# Patient Record
Sex: Male | Born: 1975 | Race: Black or African American | Hispanic: No | Marital: Single | Smoking: Former smoker
Health system: Southern US, Community
[De-identification: ages and names within clinical notes are randomized; demographics above are authoritative.]

## PROBLEM LIST (undated history)

## (undated) DIAGNOSIS — K219 Gastro-esophageal reflux disease without esophagitis: Secondary | ICD-10-CM

## (undated) DIAGNOSIS — J45909 Unspecified asthma, uncomplicated: Secondary | ICD-10-CM

---

## 2019-06-13 ENCOUNTER — Emergency Department (HOSPITAL_COMMUNITY)
Admission: EM | Admit: 2019-06-13 | Discharge: 2019-06-13 | Disposition: A | Discharge status: Home / Self Care | Payer: PRIVATE HEALTH INSURANCE | Attending: Emergency Medicine | Admitting: Emergency Medicine

## 2019-06-13 ENCOUNTER — Emergency Department (HOSPITAL_COMMUNITY): Payer: PRIVATE HEALTH INSURANCE

## 2019-06-13 ENCOUNTER — Encounter (HOSPITAL_COMMUNITY): Payer: Self-pay

## 2019-06-13 DIAGNOSIS — J4521 Mild intermittent asthma with (acute) exacerbation: Secondary | ICD-10-CM | POA: Insufficient documentation

## 2019-06-13 DIAGNOSIS — Z87891 Personal history of nicotine dependence: Secondary | ICD-10-CM | POA: Diagnosis not present

## 2019-06-13 DIAGNOSIS — R0602 Shortness of breath: Secondary | ICD-10-CM | POA: Diagnosis present

## 2019-06-13 HISTORY — DX: Unspecified asthma, uncomplicated: J45.909

## 2019-06-13 HISTORY — DX: Gastro-esophageal reflux disease without esophagitis: K21.9

## 2019-06-13 MED ORDER — FLUTICASONE-SALMETEROL 100-50 MCG/DOSE IN AEPB: 1.0000 | INHALATION_SPRAY | Freq: Two times a day (BID) | RESPIRATORY_TRACT | 0 refills | Status: AC

## 2019-06-13 MED ORDER — PREDNISONE 50 MG PO TABS: 50.0000 mg | ORAL_TABLET | Freq: Every day | ORAL | 0 refills | Status: AC

## 2019-06-13 MED ORDER — ALBUTEROL SULFATE HFA 108 (90 BASE) MCG/ACT IN AERS
2.0000 | INHALATION_SPRAY | Freq: Once | RESPIRATORY_TRACT | Status: AC
  Administered 2019-06-13: 15:00:00 2 via RESPIRATORY_TRACT
  Filled 2019-06-13: qty 6.7

## 2019-06-13 MED ORDER — PREDNISONE 20 MG PO TABS
60.0000 mg | ORAL_TABLET | Freq: Once | ORAL | Status: AC
  Administered 2019-06-13: 15:00:00 60 mg via ORAL
  Filled 2019-06-13 (×2): qty 3

## 2019-06-13 NOTE — Discharge Instructions (Signed)
Take Advair twice daily as prescribed.  Take prednisone as prescribed until completed.  Use albuterol inhaler every 4-6 hours as needed for shortness of breath, wheezing, or chest tightness.  Please follow-up establish care with a primary care provider by calling one of the numbers below.

## 2019-06-13 NOTE — ED Provider Notes (Signed)
Paul Mckinney Provider Note   CSN: [id number] Arrival date & time: 06/13/19  1412    History   Chief Complaint Chief Complaint  Patient presents with  . Asthma    HPI Paul Mckinney is a 43 y.o. male with history of asthma, GERD who presents with a one-week history of intermittent flareup of his asthma.  He is associated productive cough, wheezing, shortness of breath, chest tightness.  He denies any fever, loss of taste or smell.  He reports this feels like his normal asthma flares except for the mucus in his cough.  He has been using his nieces albuterol inhaler with relief, however he does not have any medication of his own right now.  [place]  He used to be on Advair a few months ago.     HPI  Past Medical History:  Diagnosis Date  . Asthma   . GERD (gastroesophageal reflux disease)     There are no active problems to display for this patient.   History reviewed. No pertinent surgical history.      Home Medications    Prior to Admission medications   Medication Sig Start Date End Date Taking? Authorizing Provider  Fluticasone-Salmeterol (ADVAIR DISKUS) 100-50 MCG/DOSE AEPB Inhale 1 puff into the lungs 2 (two) times daily. 06/13/19   Catricia Scheerer, Waylan BogaAlexandra M, PA-C  predniSONE (DELTASONE) 50 MG tablet Take 1 tablet (50 mg total) by mouth daily with breakfast. 06/13/19   Ramona Ruark, Waylan BogaAlexandra M, PA-C    Family History Family History  Problem Relation Age of Onset  . Seizures Mother   . Hypertension Father     Social History Social History   Tobacco Use  . Smoking status: Former Smoker    Types: Cigarettes  . Smokeless tobacco: Never Used  Substance Use Topics  . Alcohol use: Yes  . Drug use: Never     Allergies   Patient has no known allergies.   Review of Systems Review of Systems  Constitutional: Negative for fever.  Respiratory: Positive for cough, chest tightness, shortness of breath and  wheezing.      Physical Exam Updated Vital Signs BP (!) 139/92 (BP Location: Left Arm)   Pulse 62   Temp 98.7 F (37.1 C) (Oral)   Resp 15   Ht 6\' 3"  (1.905 m)   Wt 128.4 kg   SpO2 97%   BMI 35.37 kg/m   Physical Exam Vitals signs and nursing note reviewed.  Constitutional:      General: He is not in acute distress.    Appearance: He is well-developed. He is not diaphoretic.  HENT:     Head: Normocephalic and atraumatic.     Mouth/Throat:     Pharynx: No oropharyngeal exudate.  Eyes:     General: No scleral icterus.       Right eye: No discharge.        Left eye: No discharge.     Conjunctiva/sclera: Conjunctivae normal.     Pupils: Pupils are equal, round, and reactive to light.  Neck:     Musculoskeletal: Normal range of motion and neck supple.     Thyroid: No thyromegaly.  Cardiovascular:     Rate and Rhythm: Normal rate and regular rhythm.     Heart sounds: Normal heart sounds. No murmur. No friction rub. No gallop.   Pulmonary:     Effort: Pulmonary effort is normal. No respiratory distress.     Breath sounds: Normal  breath sounds. No stridor. No wheezing or rales.  Abdominal:     General: Bowel sounds are normal. There is no distension.     Palpations: Abdomen is soft.     Tenderness: There is no abdominal tenderness. There is no guarding or rebound.  Lymphadenopathy:     Cervical: No cervical adenopathy.  Skin:    General: Skin is warm and dry.     Coloration: Skin is not pale.     Findings: No rash.  Neurological:     Mental Status: He is alert.     Coordination: Coordination normal.      ED Treatments / Results  Labs (all labs ordered are listed, but only abnormal results are displayed) Labs Reviewed - No data to display  EKG None  Radiology Dg Chest 2 View  Result Date: 06/13/2019 CLINICAL DATA:  Cough.  Asthma. EXAM: CHEST - 2 VIEW COMPARISON:  None. FINDINGS: Lungs are clear. Heart size and pulmonary vascularity are normal. No  adenopathy. No pneumothorax. No bone lesions. IMPRESSION: No edema or consolidation. Electronically Signed   By: Lowella Grip III M.D.   On: 06/13/2019 15:16    Procedures Procedures (including critical care time)  Medications Ordered in ED Medications  albuterol (VENTOLIN HFA) 108 (90 Base) MCG/ACT inhaler 2 puff (2 puffs Inhalation Given 06/13/19 1525)  predniSONE (DELTASONE) tablet 60 mg (60 mg Oral Given 06/13/19 1526)     Initial Impression / Assessment and Plan / ED Course  I have reviewed the triage vital signs and the nursing notes.  Pertinent labs & imaging results that were available during my care of the patient were reviewed by me and considered in my medical decision making (see chart for details).        Patient presenting with intermittent asthma flareup over the past week.  He is also had a new cough, which is not typical for his asthma.  Low suspicion of COVID-19.  Patient is afebrile.  Chest x-ray is clear.  Oxygen saturations in the mid to upper 90s.  Patient is in no acute distress.  Patient given albuterol inhaler and will write for Advair as patient has not had it in the past few months.  We will also discharge home with a 5-day burst of prednisone.  Patient given information to establish care with a PCP.  Return precautions discussed.  Patient understands and agrees with plan.  Patient vital stable throughout ED course and discharged in satisfactory condition.  Paul Mckinney was evaluated in Emergency Department on 06/13/2019 for the symptoms described in the history of present illness. He was evaluated in the context of the global COVID-19 pandemic, which necessitated consideration that the patient might be at risk for infection with the SARS-CoV-2 virus that causes COVID-19. Institutional protocols and algorithms that pertain to the evaluation of patients at risk for COVID-19 are in a state of rapid change based on information released by regulatory bodies including  the CDC and federal and state organizations. These policies and algorithms were followed during the patient's care in the ED.   Final Clinical Impressions(s) / ED Diagnoses   Final diagnoses:  Mild intermittent asthma with exacerbation    ED Discharge Orders         Ordered    Fluticasone-Salmeterol (ADVAIR DISKUS) 100-50 MCG/DOSE AEPB  2 times daily     06/13/19 1537    predniSONE (DELTASONE) 50 MG tablet  Daily with breakfast     06/13/19 1537  Emi HolesLaw, Aaniyah Strohm M, PA-C 06/13/19 [id number]    Loren RacerYelverton, David, MD 06/14/19 [id number]

## 2019-06-13 NOTE — ED Triage Notes (Signed)
Patient c/o intermittent asthma flare up x 1 week. Patient states he took puffs of his niece's albuterol inhaler which helped some,but states he does not have an inhaler of his own. Patient also reports that he recently quit smoking.

## 2020-10-16 IMAGING — CR CHEST - 2 VIEW
2 series · 2 of 2 positions shown · non-contrast
Comparison: None.

CLINICAL DATA: Cough.  Asthma.

EXAM:
CHEST - 2 VIEW

[w chest pa]
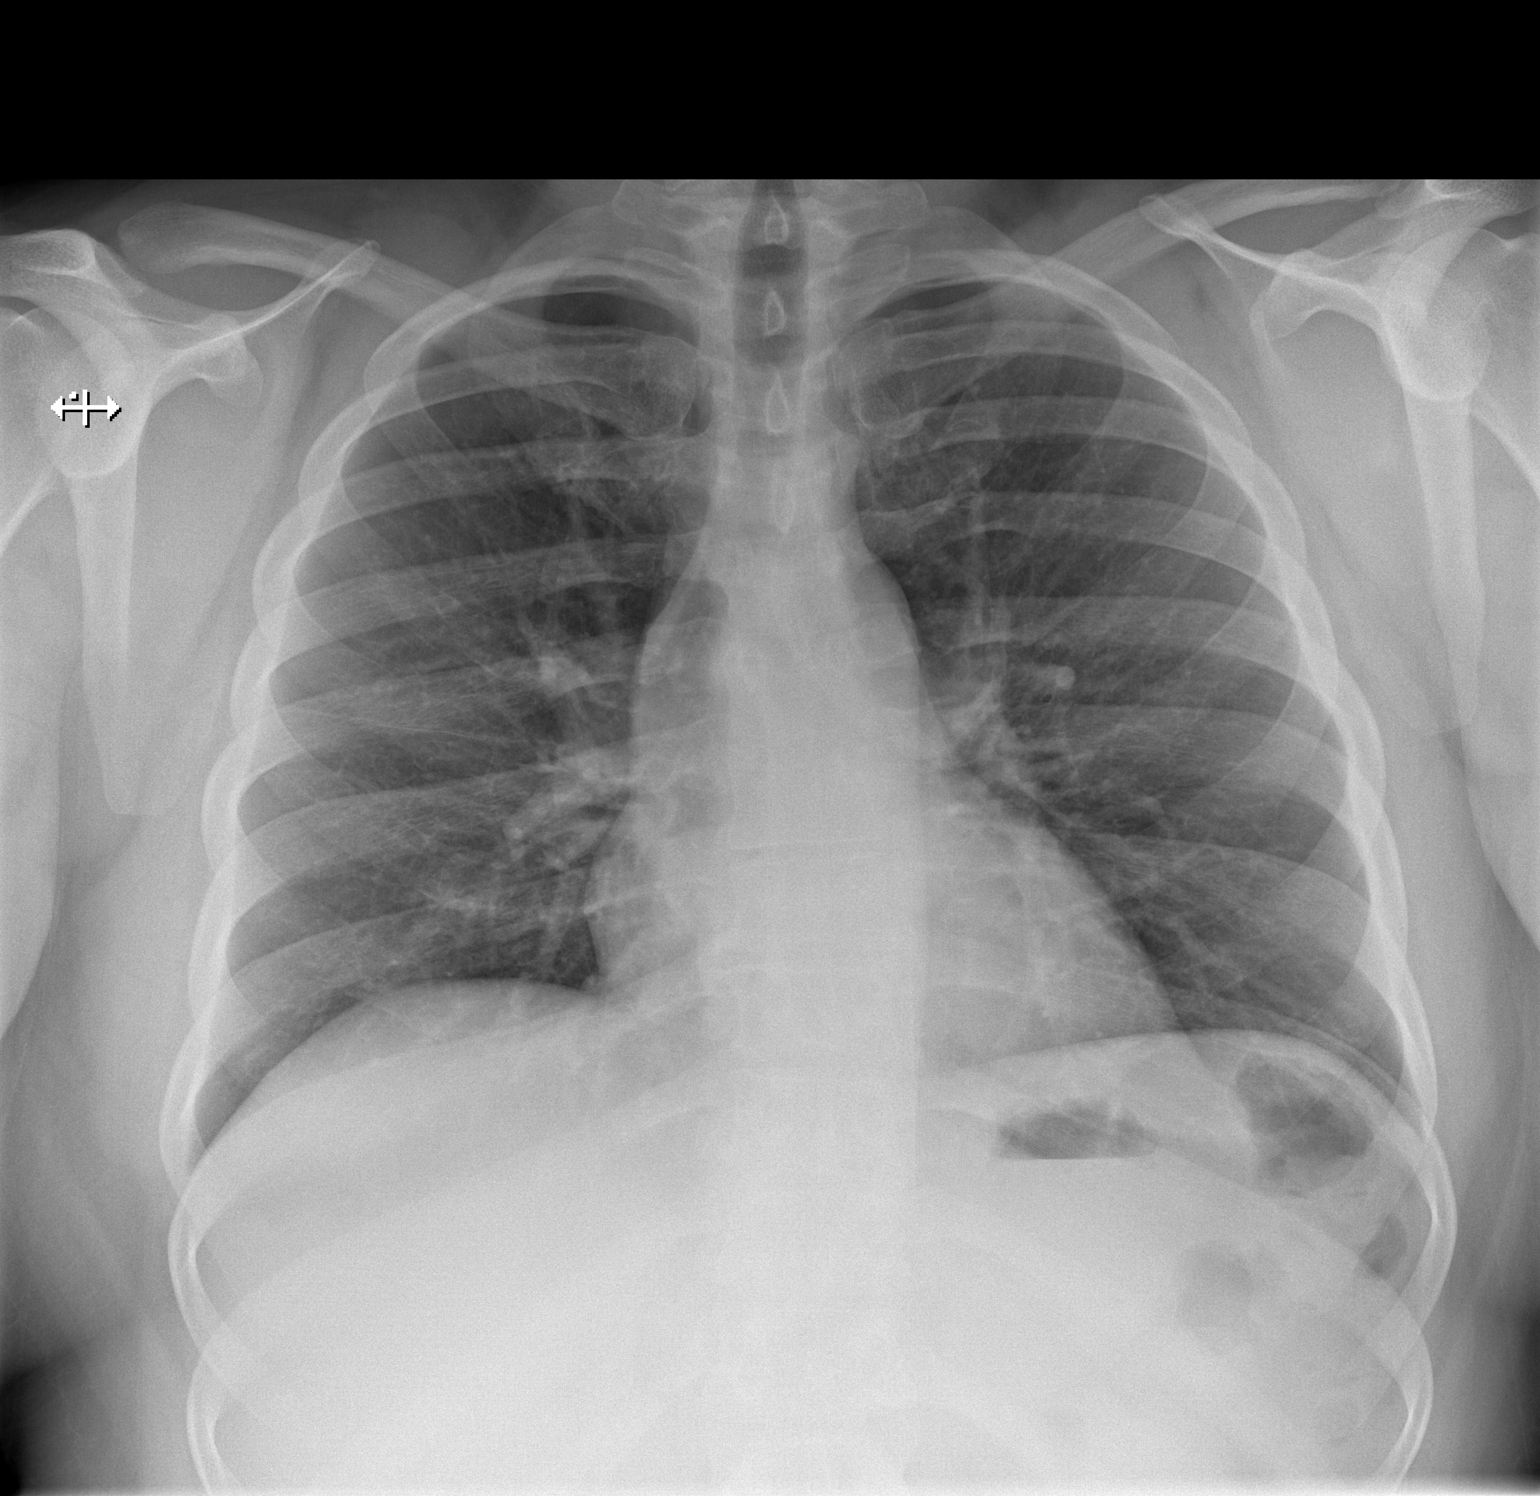

[w chest lat]
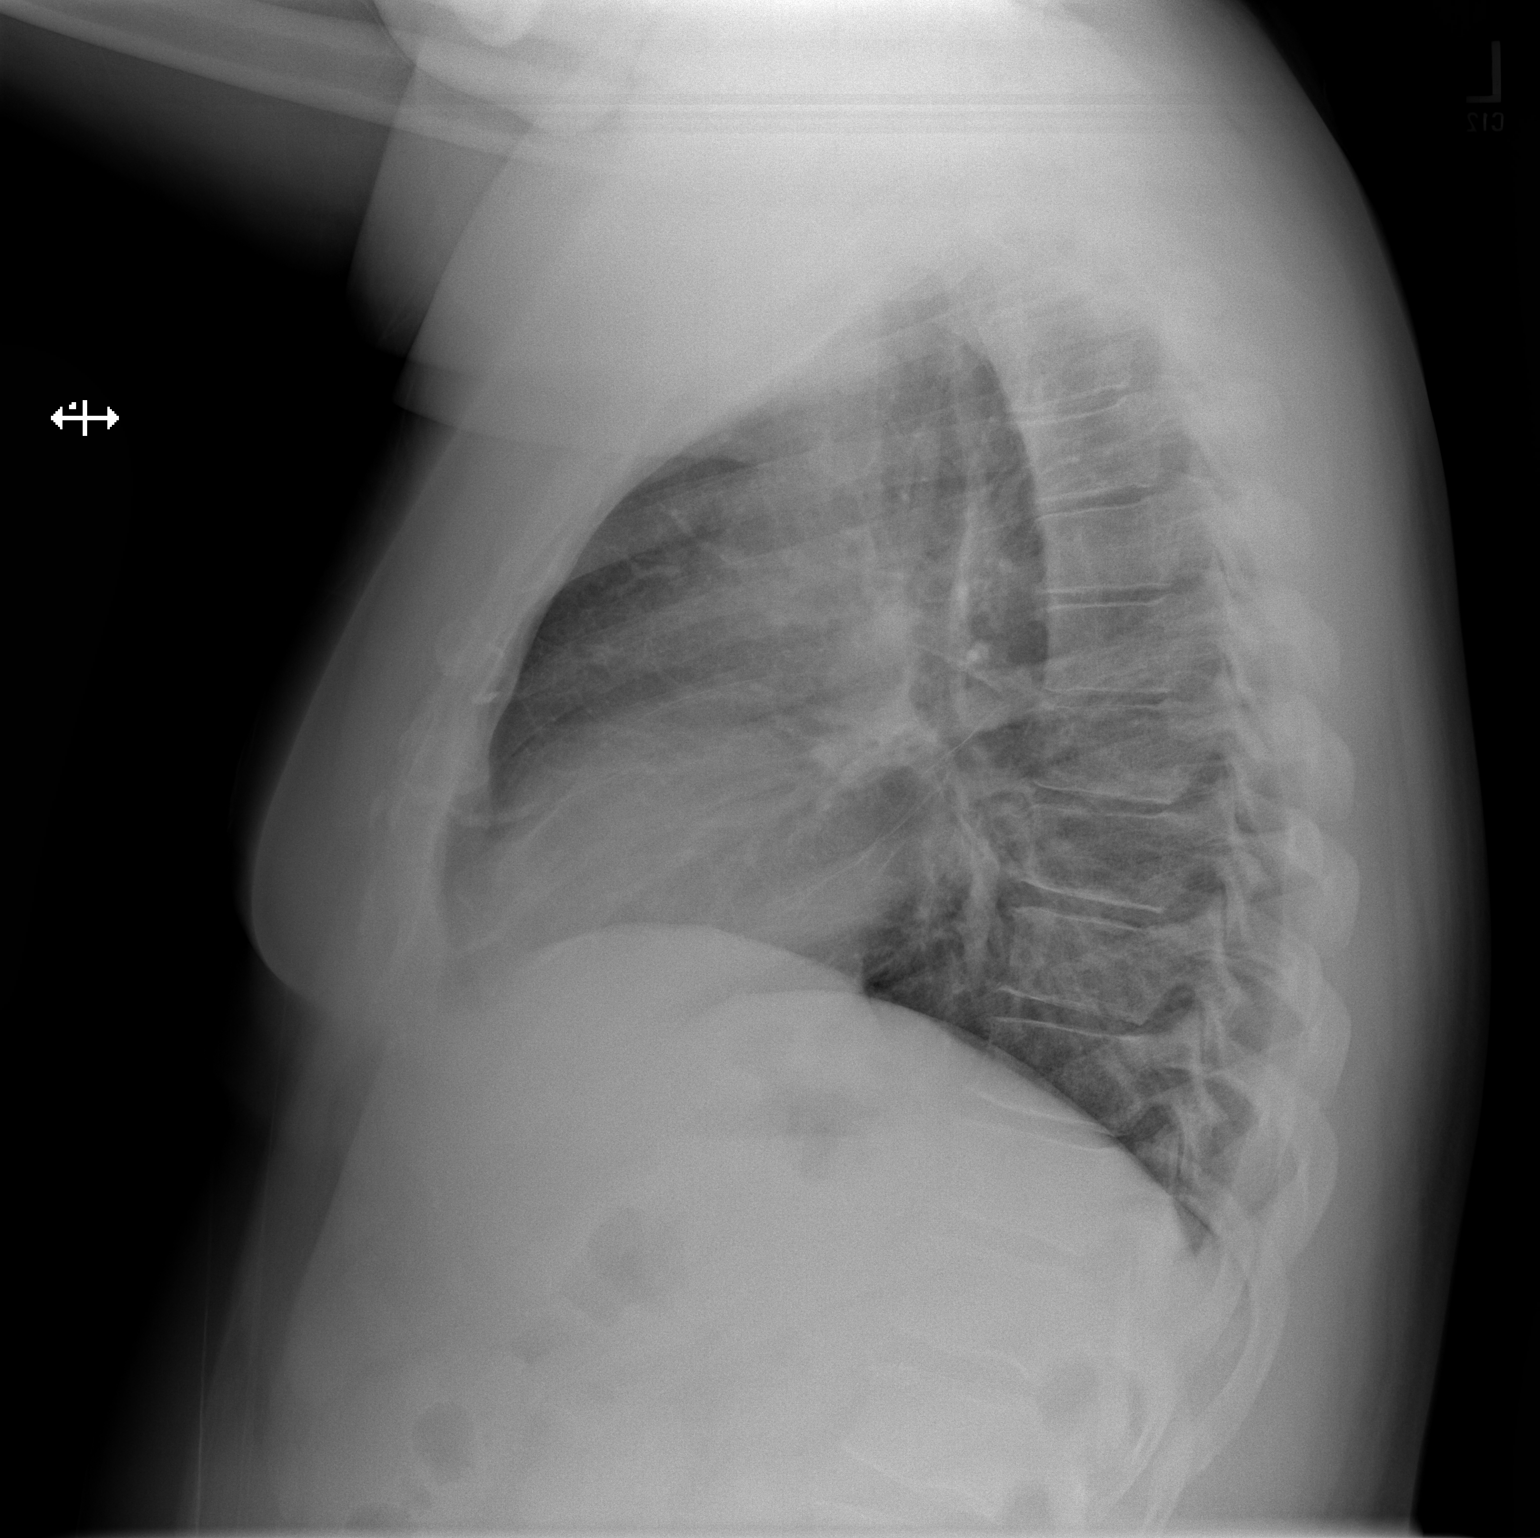

[2 of 2 positions shown; findings below may reference images not displayed]

FINDINGS: Lungs are clear. Heart size and pulmonary vascularity are normal. No
adenopathy. No pneumothorax. No bone lesions.
IMPRESSION: No edema or consolidation.
